# Patient Record
Sex: Female | Born: 1995 | Race: White | Hispanic: No | Marital: Single | Smoking: Never smoker
Health system: Southern US, Community
[De-identification: ages and names within clinical notes are randomized; demographics above are authoritative.]

## PROBLEM LIST (undated history)

## (undated) HISTORY — PX: OTHER SURGICAL HISTORY: SHX169

---

## 2018-07-11 ENCOUNTER — Other Ambulatory Visit: Payer: Self-pay | Admitting: Family Medicine

## 2018-07-11 DIAGNOSIS — R102 Pelvic and perineal pain: Secondary | ICD-10-CM

## 2018-07-20 ENCOUNTER — Ambulatory Visit
Admission: RE | Admit: 2018-07-20 | Discharge: 2018-07-20 | Disposition: A | Discharge status: Home / Self Care | Payer: Commercial Managed Care - PPO | Source: Ambulatory Visit | Attending: Family Medicine | Admitting: Family Medicine

## 2018-07-20 DIAGNOSIS — R102 Pelvic and perineal pain: Secondary | ICD-10-CM

## 2018-11-06 ENCOUNTER — Ambulatory Visit: Payer: Commercial Managed Care - PPO | Admitting: Physical Therapy

## 2018-12-31 ENCOUNTER — Ambulatory Visit: Payer: Commercial Managed Care - PPO | Admitting: Physical Therapy

## 2019-01-02 ENCOUNTER — Encounter: Payer: Self-pay | Admitting: Physical Therapy

## 2019-01-02 ENCOUNTER — Other Ambulatory Visit: Payer: Self-pay

## 2019-01-02 ENCOUNTER — Ambulatory Visit: Payer: Commercial Managed Care - PPO | Attending: Family Medicine | Admitting: Physical Therapy

## 2019-01-02 DIAGNOSIS — M62838 Other muscle spasm: Secondary | ICD-10-CM | POA: Diagnosis present

## 2019-01-02 DIAGNOSIS — M6281 Muscle weakness (generalized): Secondary | ICD-10-CM

## 2019-01-02 NOTE — Therapy (Signed)
[facility] [address], [address] [place], [place], [address] Phone: [phone]   Fax:  [phone]  Physical Therapy Evaluation  Patient Details  Name: Eileen Johnston MRN: [id number] Date of Birth: 1996-08-11 Referring Provider (PT): Overton Mam, FNP   Encounter Date: 01/02/2019  PT End of Session - 01/02/19 1700    Visit Number  1    Date for PT Re-Evaluation  02/27/19    PT Start Time  1600    PT Stop Time  1640    PT Time Calculation (min)  40 min    Activity Tolerance  Patient tolerated treatment well    Behavior During Therapy  [facility] for tasks assessed/performed       No past medical history on file.    There were no vitals filed for this visit.   Subjective Assessment - 01/02/19 1607    Subjective  Pt has been having pain in low abdomen and deeper to the hip flexors since December.      Limitations  Walking    How long can you walk comfortably?  hiking for 15-20 min, but then     Patient Stated Goals  be able to hike, have intercourse without pain    Currently in Pain?  Yes    Pain Score  7    5-7 depends   Pain Location  Abdomen    Pain Orientation  Right;Left   more on the left   Pain Descriptors / Indicators  Sharp    Pain Type  Acute pain    Pain Onset  More than a month ago    Pain Frequency  Intermittent    Aggravating Factors   intercourse and a lot of activity    Pain Relieving Factors  heating pad and goes away in a couple hours gradually         [facility] PT Assessment - 01/02/19 0001      Assessment   Medical Diagnosis  R10.2 (ICD-10-CM) - Pelvic and perineal pain    Referring Provider (PT)  Overton Mam, FNP    Onset Date/Surgical Date  --   sometime in December 2019   Prior Therapy  No      Precautions   Precautions  None      Restrictions   Weight Bearing Restrictions  No      Balance Screen   Has the patient fallen in the past 6 months  No      Home Environment   Living  Environment  Private residence    Living Arrangements  Non-relatives/Friends   3 roommates     Prior Function   Level of Independence  Independent      Cognition   Overall Cognitive Status  Within Functional Limits for tasks assessed      Posture/Postural Control   Posture/Postural Control  No significant limitations      ROM / Strength   AROM / PROM / Strength  Strength      Strength   Overall Strength Comments  Lt hip adduction and abduction 4/5; Rt LE 5/5      Palpation   SI assessment   WFL no abnormalities in standing - stork test negative; supine had difficutly stabilizing and ASLR more difficutly on the left - improved with pelvic compression    Palpation comment  left>Rt hip flexor TTP      Ambulation/Gait   Gait Pattern  Within Functional Limits  Objective measurements completed on examination: See above findings.    Pelvic Floor Special Questions - 01/02/19 0001    Prior Pelvic/Prostate Exam  Yes    Date of Last Pelvic/Prostate Exam  --   2 months ago   Are you Pregnant or attempting pregnancy?  No    Prior Pregnancies  No    Currently Sexually Active  Yes    Is this Painful  Yes    Marinoff Scale  discomfort that does not affect completion    Urinary Leakage  No    Urinary urgency  No    Falling out feeling (prolapse)  No    Skin Integrity  Intact    Perineal Body/Introitus   Normal    Pelvic Floor Internal Exam  pt identity confirmed and informed consent given to perform internal soft tissue assessment and treatment    Exam Type  Vaginal    Sensation  normal    Palpation  left levator TTP - trigger point released with STM treatment    Tone  normal       [facility] Adult PT Treatment/Exercise - 01/02/19 0001      Exercises   Exercises  Other Exercises    Other Exercises   initial exercises HEP performed and educated      Manual Therapy   Manual Therapy  Internal Pelvic Floor;Other (comment)    Manual therapy comments  pt  identity confirmed and informed consent given to perform internal soft tissue assessment and treatment    Internal Pelvic Floor  left levator and OI    Other Manual Therapy  self massage techniques demo and educated             PT Education - 01/02/19 1637    Education Details   Access Code: [email or web address] and self massage techniques    Person(s) Educated  Patient    Methods  Explanation;Demonstration;Handout;Verbal cues;Tactile cues    Comprehension  Verbalized understanding;Returned demonstration       PT Short Term Goals - 01/02/19 1732      PT SHORT TERM GOAL #1   Title  ind with self massage and initial HEP    Time  4    Period  Weeks    Status  New    Target Date  01/30/19        PT Long Term Goals - 01/02/19 1732      PT LONG TERM GOAL #1   Title  Pt will be able to go for hike without experiencing abdominal and pelvic pain after    Time  8    Period  Weeks    Status  New    Target Date  02/27/19      PT LONG TERM GOAL #2   Title  Pt will be able to have intercourse pain free    Time  8    Period  Weeks    Status  New    Target Date  02/27/19      PT LONG TERM GOAL #3   Title  Pt will demonstrate ability to maintain pelvic stability during core strengthening exercises and functional movements such as lifting    Time  8    Period  Weeks    Status  New    Target Date  02/27/19      PT LONG TERM GOAL #4   Title  Pt will be ind with advnaced HEP    Time  8    Period  Weeks    Status  New    Target Date  02/27/19             Plan - 01/02/19 1734    Clinical Impression Statement  Pt presents to clinic due to recent onset of pelvic pain that occurs after intercourse or exercising.  Pt demonstrates core and hip weakness as mentioned above.  She has levator and hip flexors muscle spasms and trigger points that are greater on the left.  Pt willl benefit from skilled PT to address all impairments and muscle imbalances so she can return to maximum regular  activiites.    Clinical Decision Making  Low    Rehab Potential  Excellent    PT Frequency  2x / week    PT Duration  8 weeks    PT Treatment/Interventions  ADLs/Self Care Home Management;Biofeedback;Cryotherapy;Electrical Stimulation;Moist Heat;Therapeutic activities;Therapeutic exercise;Neuromuscular re-education;Manual techniques;Dry needling;Passive range of motion;Taping;Patient/family education    PT Next Visit Plan  f/u on self massage, core and hip strengthening, internal STM and strength test for muscle coordination    PT Home Exercise Plan  Access Code: [email or web address]     Consulted and Agree with Plan of Care  Patient       Patient will benefit from skilled therapeutic intervention in order to improve the following deficits and impairments:  Pain, Increased fascial restricitons, Increased muscle spasms, Impaired tone, Decreased strength  Visit Diagnosis: Other muscle spasm  Muscle weakness (generalized)     Problem List There are no active problems to display for this patient.   Junious Silk, PT 01/02/2019, 5:38 PM  [facility] [address] [place], [place], [address] Phone: [phone]   Fax:  [phone]  Name: Janete Rechner MRN: [id number] Date of Birth: 1996/01/04

## 2019-01-02 NOTE — Patient Instructions (Signed)
Access Code: [email or web address]  URL: [email or web address]  Date: 01/02/2019  Prepared by: Dwana Curd   Exercises  Supine Transversus Abdominis Bracing with Leg Extension - 10 reps - 3 sets - 1x daily - 7x weekly  Supine Transversus Abdominis Bracing with Double Leg Fallout - 10 reps - 2 sets - 1x daily - 7x weekly

## 2019-01-04 ENCOUNTER — Encounter: Payer: Self-pay | Admitting: Physical Therapy

## 2019-01-11 ENCOUNTER — Encounter: Payer: Self-pay | Admitting: Physical Therapy

## 2019-01-11 ENCOUNTER — Ambulatory Visit: Payer: Commercial Managed Care - PPO | Admitting: Physical Therapy

## 2019-01-11 ENCOUNTER — Other Ambulatory Visit: Payer: Self-pay

## 2019-01-11 DIAGNOSIS — M62838 Other muscle spasm: Secondary | ICD-10-CM

## 2019-01-11 DIAGNOSIS — M6281 Muscle weakness (generalized): Secondary | ICD-10-CM

## 2019-01-11 NOTE — Patient Instructions (Signed)
Access Code: [email or web address]  URL: [email or web address]  Date: 01/11/2019  Prepared by: Dwana Curd   Exercises  Small Range Straight Leg Raise - 10 reps - 3 sets - 1x daily - 7x weekly  Supine Transversus Abdominis Bracing with Double Leg Fallout - 10 reps - 2 sets - 1x daily - 7x weekly  Sidelying Hip Abduction - 10 reps - 3 sets - 1x daily - 7x weekly  Bridge - 10 reps - 3 sets - 5 sec hold - 1x daily - 7x weekly  Hip Extension with Resistance Loop - 10 reps - 3 sets - 1x daily - 7x weekly  Standing Hip Abduction with Resistance at Ankles and Counter Support - 10 reps - 3 sets - 1x daily - 7x weekly  Standing Hip Flexion with Resistance Loop - 10 reps - 3 sets - 1x daily - 7x weekly

## 2019-01-11 NOTE — Therapy (Signed)
[facility] [address] [place], [place], [address] Phone: [phone]   Fax:  [phone]  Physical Therapy Treatment  Patient Details  Name: Eileen Johnston MRN: [id number] Date of Birth: 01/13/1996 Referring Provider (PT): Overton Mam, FNP   Encounter Date: 01/11/2019  PT End of Session - 01/11/19 1044    Visit Number  2    Date for PT Re-Evaluation  02/27/19    PT Start Time  1030    PT Stop Time  1110    PT Time Calculation (min)  40 min    Activity Tolerance  Patient tolerated treatment well    Behavior During Therapy  [facility] for tasks assessed/performed       History reviewed. No pertinent past medical history.  Past Surgical History:  Procedure Laterality Date  . surgery for urethral reflux age 50     per patient    There were no vitals filed for this visit.  Subjective Assessment - 01/11/19 1030    Subjective  Pt states she has had the pain throughout the week after exercises.    Currently in Pain?  Yes    Pain Score  4     Pain Location  Abdomen    Pain Orientation  Left;Right;Lower   Lt>Rt   Pain Descriptors / Indicators  Sharp    Pain Type  Acute pain    Pain Onset  More than a month ago    Pain Frequency  Intermittent    Multiple Pain Sites  No         [facility] PT Assessment - 01/11/19 0001      Special Tests    Special Tests  Hip Special Tests    Hip Special Tests   Other      other   Findings  Positive    Side  Left    Comments  Fitzgerald test for posterior labral tear - she also has a lot of popping with eccentric hip extension from flexion                   [facility] Adult PT Treatment/Exercise - 01/11/19 0001      Exercises   Exercises  Knee/Hip      Knee/Hip Exercises: Standing   Other Standing Knee Exercises  hip 3 ways with yellow band - 15x each bilat    Other Standing Knee Exercises  side step yellow band - 15 x each way      Manual Therapy   Manual Therapy   Soft tissue mobilization    Soft tissue mobilization  bilat hip flexors Lt>Rt; left QL and lumbar paraspinals and gluteal attachments             PT Education - 01/11/19 1116    Education Details   Access Code: [email or web address]     Person(s) Educated  Patient    Methods  Explanation;Demonstration;Handout;Verbal cues    Comprehension  Verbalized understanding;Returned demonstration;Verbal cues required       PT Short Term Goals - 01/02/19 1732      PT SHORT TERM GOAL #1   Title  ind with self massage and initial HEP    Time  4    Period  Weeks    Status  New    Target Date  01/30/19        PT Long Term Goals - 01/11/19 1045      PT LONG TERM GOAL #1   Title  Pt  will be able to go for hike without experiencing abdominal and pelvic pain after    Baseline  45 min hike but felt pain after            Plan - 01/11/19 1116    Clinical Impression Statement  Pt has some popping and clicking in her hip with eccentric hip flexion during labral tear test.  Pt was recommended to get her hip checked by an orthropedic MD.  Pt did well with stability exercises.  She needs cues to keep pelvis nuetral.  She was fatigued with exercises given today and exercises were added to her HEP.  She will benefit from skilled PT to continue to address pelvic and deep hip pain and strength.    PT Treatment/Interventions  ADLs/Self Care Home Management;Biofeedback;Cryotherapy;Electrical Stimulation;Moist Heat;Therapeutic activities;Therapeutic exercise;Neuromuscular re-education;Manual techniques;Dry needling;Passive range of motion;Taping;Patient/family education    PT Next Visit Plan  hip stability, internal massage with pelvic strength test    PT Home Exercise Plan  Access Code: [email or web address]     Consulted and Agree with Plan of Care  Patient       Patient will benefit from skilled therapeutic intervention in order to improve the following deficits and impairments:  Pain, Increased fascial restricitons,  Increased muscle spasms, Impaired tone, Decreased strength  Visit Diagnosis: Other muscle spasm  Muscle weakness (generalized)     Problem List There are no active problems to display for this patient.   Brayton CavesJakki L Avett Reineck, PT 01/11/2019, 11:31 AM  [facility] [address], [address] [place], [place], [address] Phone: [phone]   Fax:  [phone]  Name: Eileen Johnston MRN: [id number] Date of Birth: Jan 10, 1996

## 2019-01-15 ENCOUNTER — Ambulatory Visit: Payer: Commercial Managed Care - PPO | Attending: Family Medicine | Admitting: Physical Therapy

## 2019-01-15 ENCOUNTER — Other Ambulatory Visit: Payer: Self-pay

## 2019-01-15 ENCOUNTER — Encounter: Payer: Self-pay | Admitting: Physical Therapy

## 2019-01-15 DIAGNOSIS — M62838 Other muscle spasm: Secondary | ICD-10-CM | POA: Diagnosis present

## 2019-01-15 DIAGNOSIS — M6281 Muscle weakness (generalized): Secondary | ICD-10-CM | POA: Diagnosis present

## 2019-01-15 NOTE — Therapy (Signed)
[facility] [address], [address] [place], [place], [address] Phone: [phone]   Fax:  [phone]  Physical Therapy Treatment  Patient Details  Name: Eileen Johnston MRN: [id number] Date of Birth: October 29, 1995 Referring Provider (PT): Overton Mam, FNP   Encounter Date: 01/15/2019  PT End of Session - 01/15/19 1530    Visit Number  3    Date for PT Re-Evaluation  02/27/19    PT Start Time  1530    PT Stop Time  1615    PT Time Calculation (min)  45 min    Activity Tolerance  Patient tolerated treatment well    Behavior During Therapy  [facility] for tasks assessed/performed       History reviewed. No pertinent past medical history.  Past Surgical History:  Procedure Laterality Date  . surgery for urethral reflux age 68     per patient    There were no vitals filed for this visit.  Subjective Assessment - 01/15/19 1532    Subjective  Pt states it feels like the massage is getting easier and less tight.  Intercourse is still painful afterwards.    How long can you walk comfortably?  hiking for 15-20 min, but then     Patient Stated Goals  be able to hike, have intercourse without pain    Currently in Pain?  No/denies                       [facility] Adult PT Treatment/Exercise - 01/15/19 0001      Neuro Re-ed    Neuro Re-ed Details   breathing and bulging pelvic floor with tactile cues      Knee/Hip Exercises: Stretches   Other Knee/Hip Stretches  windshield wiper stretches 10x 10 sec holds      Knee/Hip Exercises: Aerobic   Elliptical  L3 x 7 min      Knee/Hip Exercises: Supine   Other Supine Knee/Hip Exercises  soft foam roll - hip stretches double knee, single knee, small circles - 10 x each      Manual Therapy   Manual therapy comments  pt identity confirmed and informed consent given to perform internal soft tissue assessment and treatment    Soft tissue mobilization  TFL on Lt LE    Internal Pelvic  Floor  left levator and OI    Other Manual Therapy  disscussed self massage with massage wand and where to purchase               PT Short Term Goals - 01/15/19 1531      PT SHORT TERM GOAL #1   Title  ind with self massage and initial HEP    Status  Achieved        PT Long Term Goals - 01/11/19 1045      PT LONG TERM GOAL #1   Title  Pt will be able to go for hike without experiencing abdominal and pelvic pain after    Baseline  45 min hike but felt pain after            Plan - 01/15/19 1617    Clinical Impression Statement  Pt had a lot of tension in levators on left side.  Loosened with STM and breathing techniques.  Pt needed cues for lengthening and bulging.  She was given instructions on using a massage wand and using mirror to see the bulging at home.  Pt will benefit  from skilled PT to work on hip stability and improved pelvic floor relaxation techniques.    PT Treatment/Interventions  ADLs/Self Care Home Management;Biofeedback;Cryotherapy;Electrical Stimulation;Moist Heat;Therapeutic activities;Therapeutic exercise;Neuromuscular re-education;Manual techniques;Dry needling;Passive range of motion;Taping;Patient/family education    PT Next Visit Plan  hip stability, internal massage as needed, bulging pelvic floor    PT Home Exercise Plan  Access Code: [email or web address]     Consulted and Agree with Plan of Care  Patient       Patient will benefit from skilled therapeutic intervention in order to improve the following deficits and impairments:  Pain, Increased fascial restricitons, Increased muscle spasms, Impaired tone, Decreased strength  Visit Diagnosis: Other muscle spasm  Muscle weakness (generalized)     Problem List There are no active problems to display for this patient.   Junious SilkJakki L Murl Zogg, PT 01/15/2019, 4:24 PM  [facility] [address], [address] [place], [place], [address] Phone: [phone]    Fax:  [phone]  Name: Eileen Johnston MRN: [id number] Date of Birth: 06-11-1996

## 2019-01-22 ENCOUNTER — Ambulatory Visit: Payer: Commercial Managed Care - PPO | Admitting: Physical Therapy

## 2019-01-23 ENCOUNTER — Other Ambulatory Visit: Payer: Self-pay

## 2019-01-23 ENCOUNTER — Encounter: Payer: Self-pay | Admitting: Physical Therapy

## 2019-01-23 ENCOUNTER — Ambulatory Visit: Payer: Commercial Managed Care - PPO | Admitting: Physical Therapy

## 2019-01-23 DIAGNOSIS — M6281 Muscle weakness (generalized): Secondary | ICD-10-CM

## 2019-01-23 DIAGNOSIS — M62838 Other muscle spasm: Secondary | ICD-10-CM

## 2019-01-23 NOTE — Therapy (Signed)
[facility] [address] [place], [place], [address] Phone: [phone]   Fax:  [phone]  Physical Therapy Treatment  Patient Details  Name: Eileen Johnston MRN: [id number] Date of Birth: 21-May-1996 Referring Provider (PT): Julieanne Manson, FNP   Encounter Date: 01/23/2019  PT End of Session - 01/23/19 1842    Visit Number  4    Date for PT Re-Evaluation  02/27/19    PT Start Time  1448    PT Stop Time  1636    PT Time Calculation (min)  40 min    Activity Tolerance  Patient tolerated treatment well    Behavior During Therapy  [facility] for tasks assessed/performed       History reviewed. No pertinent past medical history.  Past Surgical History:  Procedure Laterality Date  . surgery for urethral reflux age 38     per patient    There were no vitals filed for this visit.  Subjective Assessment - 01/23/19 1759    Subjective  Pt states she hasn't been feeling the popping as much.  Pt states still has the same pain with intercourse and hiking.  Went to the orthopedic and will get an MRI to r/o labral tears.    Patient Stated Goals  be able to hike, have intercourse without pain    Currently in Pain?  No/denies                       [facility] Adult PT Treatment/Exercise - 01/23/19 0001      Knee/Hip Exercises: Stretches   Other Knee/Hip Stretches  child's pose 3x30 sec and cat cow - 10x      Knee/Hip Exercises: Standing   Hip Flexion  Stengthening;Right;Left;20 reps;Knee straight   BOSU   Hip Abduction  Stengthening;Right;Left;20 reps;Knee straight   on BOSU   Lateral Step Up  Right;Left;20 reps   on BOSU   Other Standing Knee Exercises  holding ball against the wall up and down with LE for hip abductors and stability      Knee/Hip Exercises: Seated   Other Seated Knee/Hip Exercises  on ball pelvic clocks - 10x each way      Manual Therapy   Manual therapy comments  pt identity confirmed and  informed consent given to perform internal soft tissue assessment and treatment    Internal Pelvic Floor  left levator ani muscles posterior insertion and OI insertion    Other Manual Therapy  sitting on noodle for self massage with breathing into the stretch               PT Short Term Goals - 01/15/19 1531      PT SHORT TERM GOAL #1   Title  ind with self massage and initial HEP    Status  Achieved        PT Long Term Goals - 01/23/19 1845      PT LONG TERM GOAL #1   Title  Pt will be able to go for hike without experiencing abdominal and pelvic pain after    Status  On-going      PT LONG TERM GOAL #2   Title  Pt will be able to have intercourse pain free    Status  On-going      PT LONG TERM GOAL #3   Title  Pt will demonstrate ability to maintain pelvic stability during core strengthening exercises and functional movements such as lifting  Status  On-going      PT LONG TERM GOAL #4   Title  Pt will be ind with advnaced HEP    Status  On-going            Plan - 01/23/19 1843    Clinical Impression Statement  Pt continues to have tight spot on levators on the left side.  Pt demonstrates some instability with hip exercises and needed cues to not lock out knees and engage abdominal muscles.  Pt had release with manual treatment.  She will benefit from skilled PT to continue to work on muscle spasms and hip weakness.    PT Treatment/Interventions  ADLs/Self Care Home Management;Biofeedback;Cryotherapy;Electrical Stimulation;Moist Heat;Therapeutic activities;Therapeutic exercise;Neuromuscular re-education;Manual techniques;Dry needling;Passive range of motion;Taping;Patient/family education    PT Next Visit Plan  bulging pelvic floor, massage wand as needed    PT Home Exercise Plan  Access Code: [email or web address]     Consulted and Agree with Plan of Care  Patient       Patient will benefit from skilled therapeutic intervention in order to improve the following deficits  and impairments:  Pain, Increased fascial restricitons, Increased muscle spasms, Impaired tone, Decreased strength  Visit Diagnosis: Other muscle spasm  Muscle weakness (generalized)     Problem List There are no active problems to display for this patient.   Junious SilkJakki L Salvatrice Morandi, PT 01/23/2019, 6:46 PM  [facility] [address] [place], [place], [address] Phone: [phone]   Fax:  [phone]  Name: Eileen Johnston MRN: [id number] Date of Birth: 06-28-1996

## 2019-01-29 ENCOUNTER — Encounter: Payer: Self-pay | Admitting: Physical Therapy

## 2019-01-29 ENCOUNTER — Ambulatory Visit: Payer: Commercial Managed Care - PPO | Admitting: Physical Therapy

## 2019-01-29 ENCOUNTER — Other Ambulatory Visit: Payer: Self-pay

## 2019-01-29 DIAGNOSIS — M6281 Muscle weakness (generalized): Secondary | ICD-10-CM

## 2019-01-29 DIAGNOSIS — M62838 Other muscle spasm: Secondary | ICD-10-CM

## 2019-01-29 NOTE — Therapy (Addendum)
[facility] [address], [place], [place], [address] Phone: [phone]   Fax:  [phone]  Physical Therapy Treatment  Patient Details  Name: Eileen Johnston MRN: [id number] Date of Birth: 06/20/1996 Referring Provider (PT): Julieanne Manson, FNP   Encounter Date: 01/29/2019  PT End of Session - 01/29/19 1551    Visit Number  5    Date for PT Re-Evaluation  02/27/19    PT Start Time  6222    PT Stop Time  1612    PT Time Calculation (min)  42 min    Activity Tolerance  Patient tolerated treatment well    Behavior During Therapy  [facility] for tasks assessed/performed       History reviewed. No pertinent past medical history.  Past Surgical History:  Procedure Laterality Date  . surgery for urethral reflux age 50     per patient    There were no vitals filed for this visit.  Subjective Assessment - 01/29/19 1550    Subjective  I feel like I was able to get to the muscles more with the massage.  Haven't done anything that would nornally bring the pain on lately.    Currently in Pain?  No/denies                       [facility] Adult PT Treatment/Exercise - 01/29/19 0001      Neuro Re-ed    Neuro Re-ed Details   biofeedback to work on relaxing and bulging pelvic floor with all exercises; ensured no increased tone during exercises      Knee/Hip Exercises: Stretches   Other Knee/Hip Stretches  doulble knee to chest      Knee/Hip Exercises: Standing   Hip Flexion  Stengthening;Right;Left;10 reps;Knee bent   with kegel   Functional Squat  3 sets;10 reps   to mat and to wall for cues to sit back - cue to pelvic tilt   SLS  single leg dead lift with 4lb - 10x each side      Knee/Hip Exercises: Supine   Bridges  Strengthening;Both;10 reps   with march     Knee/Hip Exercises: Sidelying   Hip ABduction  Strengthening;Right;Left;10 reps   2lb    Hip ADduction  Strengthening;Right;Left;10 reps   2lb               PT Short Term Goals - 01/15/19 1531      PT SHORT TERM GOAL #1   Title  ind with self massage and initial HEP    Status  Achieved        PT Long Term Goals - 01/23/19 1845      PT LONG TERM GOAL #1   Title  Pt will be able to go for hike without experiencing abdominal and pelvic pain after    Status  On-going      PT LONG TERM GOAL #2   Title  Pt will be able to have intercourse pain free    Status  On-going      PT LONG TERM GOAL #3   Title  Pt will demonstrate ability to maintain pelvic stability during core strengthening exercises and functional movements such as lifting    Status  On-going      PT LONG TERM GOAL #4   Title  Pt will be ind with advnaced HEP    Status  On-going  Plan - 01/29/19 1557    Clinical Impression Statement  Pt did well with being able to relax her pelvic floor.  She easily relaxed from 5-2m down to 236mat rest after doing double knee to chest stretch.  Pt needed a lot of cues to keep from increased back extension with squats.  She has difficulty with lumbopelvic stability throughout exercises    PT Treatment/Interventions  ADLs/Self Care Home Management;Biofeedback;Cryotherapy;Electrical Stimulation;Moist Heat;Therapeutic activities;Therapeutic exercise;Neuromuscular re-education;Manual techniques;Dry needling;Passive range of motion;Taping;Patient/family education    PT Next Visit Plan  core strength, f/u on massage wand if needed    PT Home Exercise Plan  Access Code: [email or web address]     Consulted and Agree with Plan of Care  Patient       Patient will benefit from skilled therapeutic intervention in order to improve the following deficits and impairments:  Pain, Increased fascial restricitons, Increased muscle spasms, Impaired tone, Decreased strength  Visit Diagnosis: 1. Other muscle spasm   2. Muscle weakness (generalized)        Problem List There are no active problems to display for this  patient.   JaCamillo Flamingesenglau, PT 01/29/2019, 5:19 PM  [facility] [address]: [phone] Fax:  [phone]: AlTalicia SuiRN: [id number] of Birth: 10Oct 30, 1997PHYSICAL THERAPY DISCHARGE SUMMARY  Visits from Start of Care: 5  Current functional level related to goals / functional outcomes: See above goals   Remaining deficits: See above details   Education / Equipment: HEP Plan: Patient agrees to discharge.  Patient goals were not met. Patient is being discharged due to not returning since the last visit.  ?????    JaAmerican ExpressPT 04/11/19 10:38 AM

## 2019-09-11 ENCOUNTER — Ambulatory Visit: Payer: Commercial Managed Care - PPO | Attending: Internal Medicine

## 2019-09-11 DIAGNOSIS — Z20822 Contact with and (suspected) exposure to covid-19: Secondary | ICD-10-CM

## 2019-09-12 LAB — NOVEL CORONAVIRUS, NAA: SARS-CoV-2, NAA: NOT DETECTED

## 2019-10-31 IMAGING — US US PELVIS COMPLETE TRANSABD/TRANSVAG
2 series · 13 of 25 positions shown · non-contrast
Comparison: None available.

CLINICAL DATA: Initial evaluation for acute pelvic pain for 3
weeks.



[Series 1: us pelvis complete transabd/transvag · 0.25mm/px · 12 of 39 slices shown (1 of 2)]
[im 1/39]
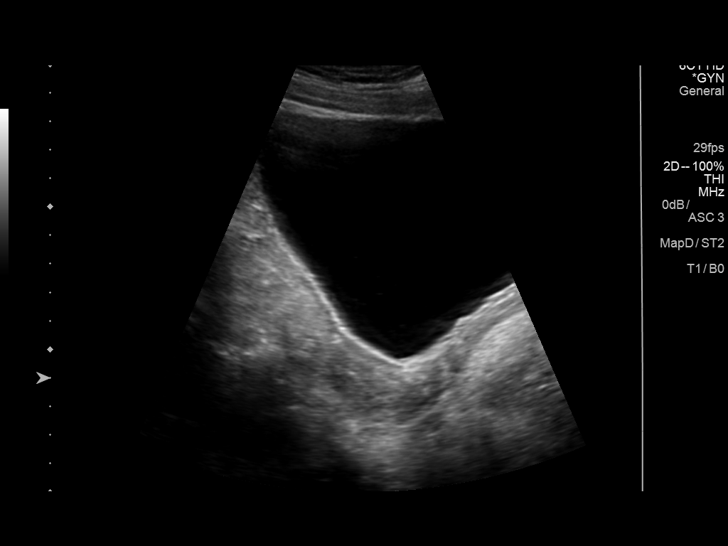
[im 4/39]
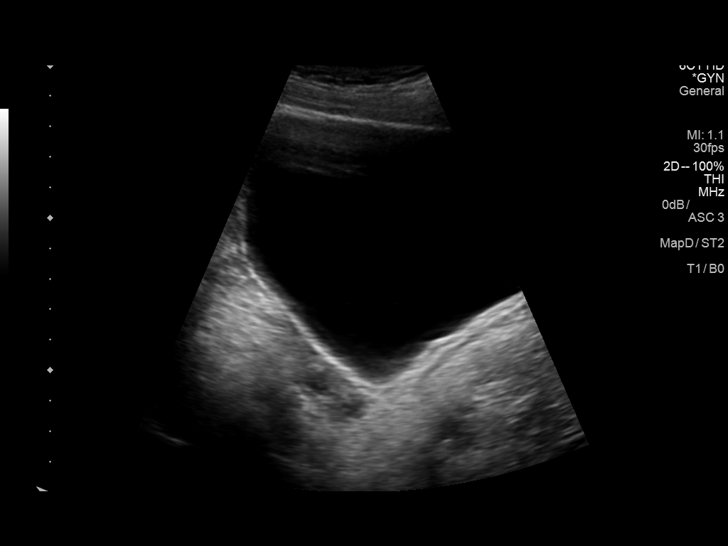
[im 7/39]
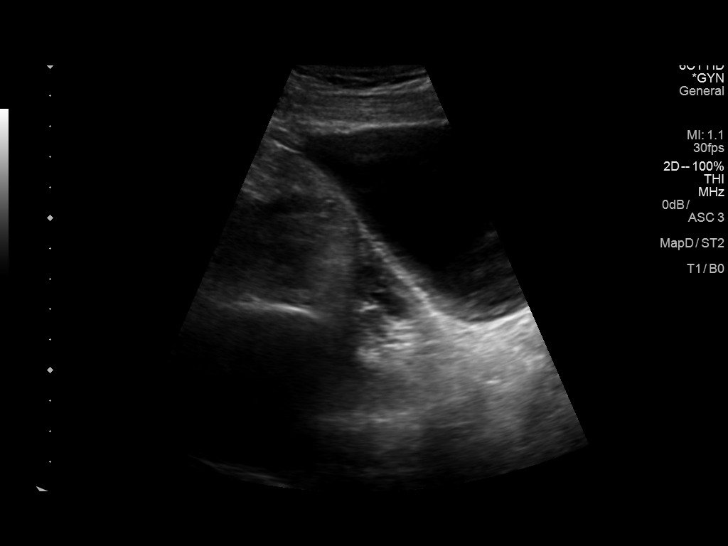
[im 10/39]
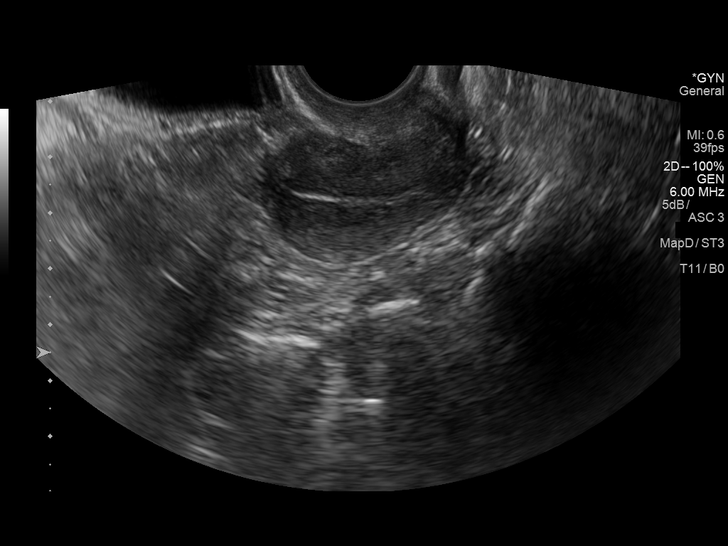
[im 14/39]
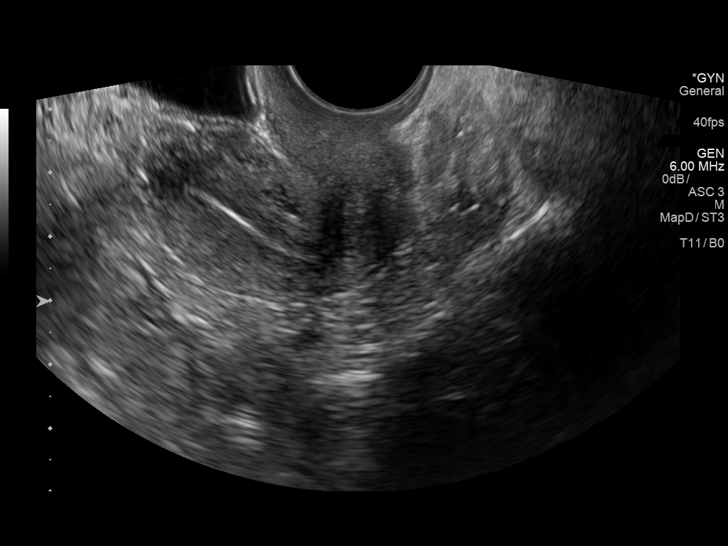
[im 17/39]
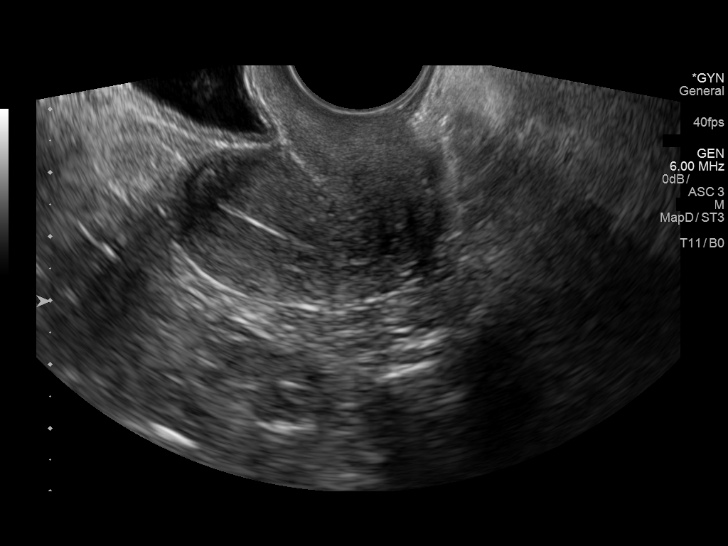
[im 20/39]
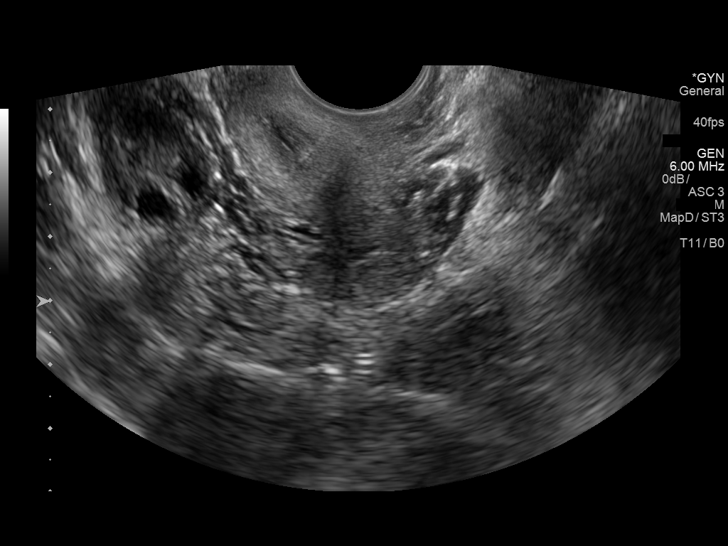
[im 24/39]
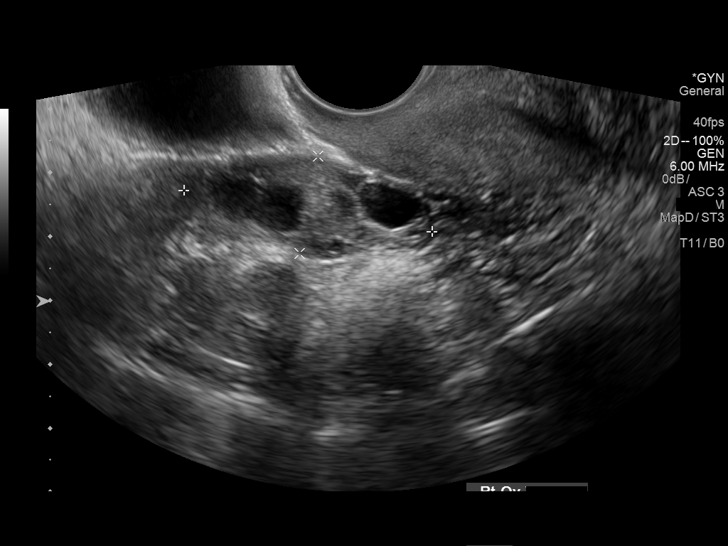
[im 27/39]
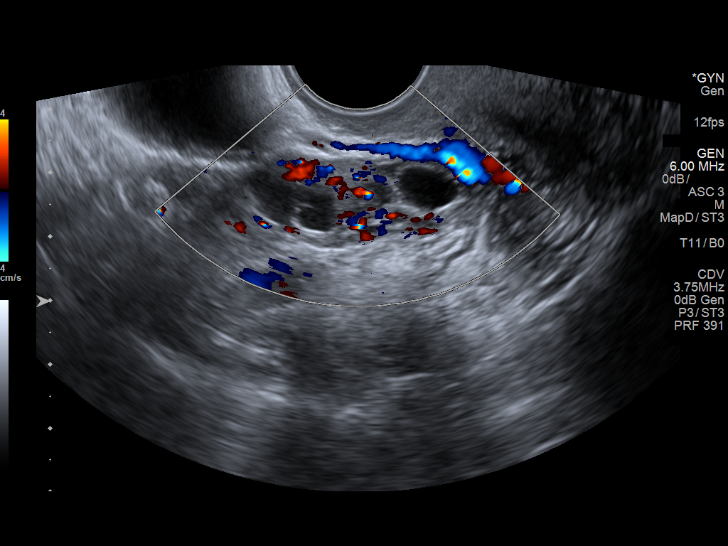
[im 30/39]
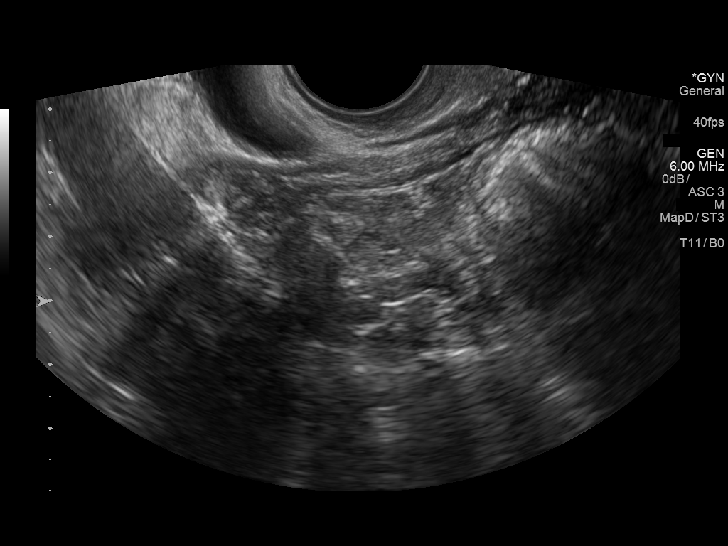
[im 34/39]
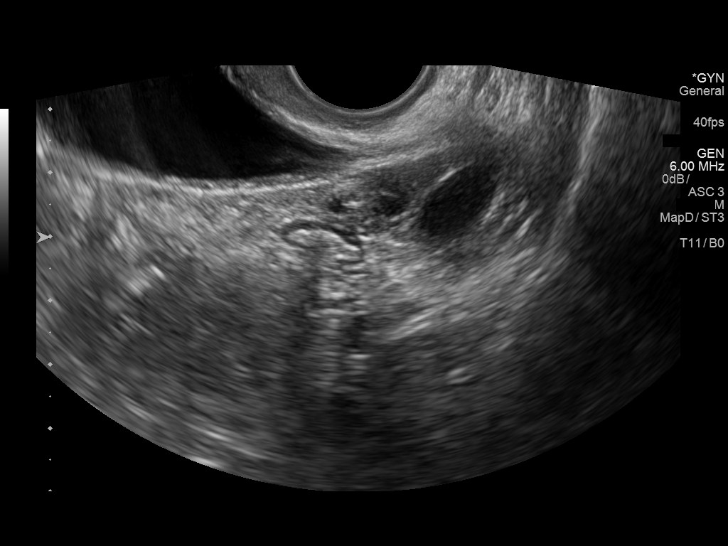
[im 37/39]
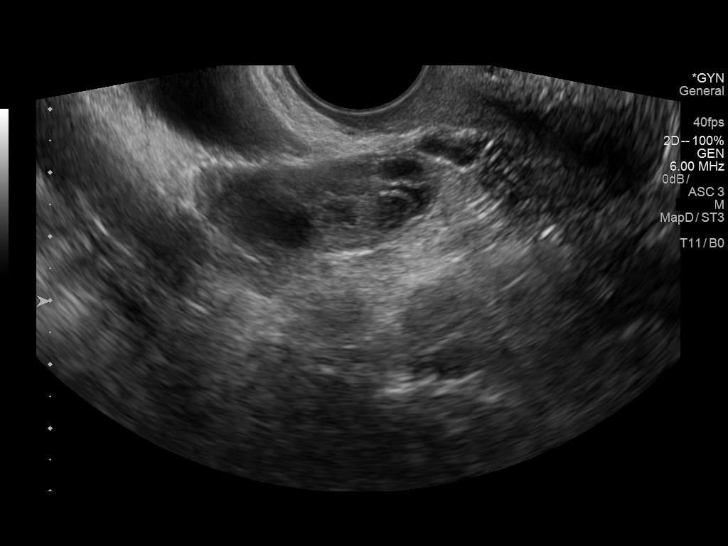

[Series 1001: us pelvis complete transabd/transvag · 0.11mm/px · 1 of 2 slices shown (2 of 2)]
[im 1/2]
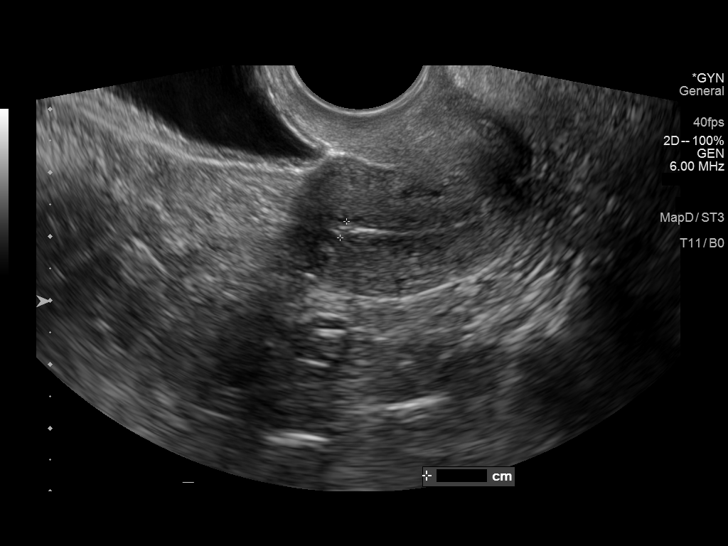

[13 of 25 positions shown; findings below may reference images not displayed]

FINDINGS: Uterus

Measurements: 4.1 x 2.3 x 3.4 cm = volume: 16.8 mL. No fibroids or
other mass visualized.

Endometrium

Thickness: 2.8 mm.  No focal abnormality visualized.

Right ovary

Measurements: 3.9 x 1.6 x 2.1 cm = volume: 6.8 mL. Normal
appearance/no adnexal mass.

Left ovary

Measurements: 3.9 x 1.4 x 2.1 cm = volume: 6.0 mL. Normal
appearance/no adnexal mass.

Other findings

No abnormal free fluid.
IMPRESSION: Normal pelvic ultrasound. No acute abnormality identified. Normal
sonographic appearance of the uterus, endometrium, and ovaries.
